# Patient Record
Sex: Female | Born: 1965 | Race: White | Hispanic: No | Marital: Married
Health system: Southern US, Community
[De-identification: ages and names within clinical notes are randomized; demographics above are authoritative.]

---

## 2014-04-24 ENCOUNTER — Encounter (INDEPENDENT_AMBULATORY_CARE_PROVIDER_SITE_OTHER): Payer: Self-pay | Admitting: *Deleted

## 2014-05-16 ENCOUNTER — Ambulatory Visit (INDEPENDENT_AMBULATORY_CARE_PROVIDER_SITE_OTHER): Payer: Self-pay | Admitting: Internal Medicine

## 2017-12-25 ENCOUNTER — Other Ambulatory Visit: Payer: Self-pay | Admitting: Gastroenterology

## 2017-12-25 DIAGNOSIS — R1032 Left lower quadrant pain: Secondary | ICD-10-CM

## 2017-12-30 ENCOUNTER — Ambulatory Visit
Admission: RE | Admit: 2017-12-30 | Discharge: 2017-12-30 | Disposition: A | Discharge status: Home / Self Care | Payer: BLUE CROSS/BLUE SHIELD | Source: Ambulatory Visit | Attending: Gastroenterology | Admitting: Gastroenterology

## 2017-12-30 ENCOUNTER — Encounter: Payer: Self-pay | Admitting: Radiology

## 2017-12-30 DIAGNOSIS — R1032 Left lower quadrant pain: Secondary | ICD-10-CM

## 2017-12-30 IMAGING — CT CT ABD-PELV W/ CM
2 of 5 series · 13 of 46 positions shown, 15 images · IV contrast (iopamidol)
Comparison: None.

CLINICAL DATA: Bilateral lower quadrant pain for 2 months.
Suspected diverticulitis. Previous appendectomy.

EXAM:
CT ABDOMEN AND PELVIS WITH CONTRAST
TECHNIQUE: Multidetector CT imaging of the abdomen and pelvis was performed
using the standard protocol following bolus administration of
intravenous contrast.
CONTRAST:  100mL [MK] IOPAMIDOL ([MK]) INJECTION 61%

[Series 2: abd pelvis 5.00 br40 s3 ax · axial · 0.44mm/px · z∈[+1126,+1466]mm · 10 of 76 slices shown, 12 images]
[im 4/76  soft-tissue]
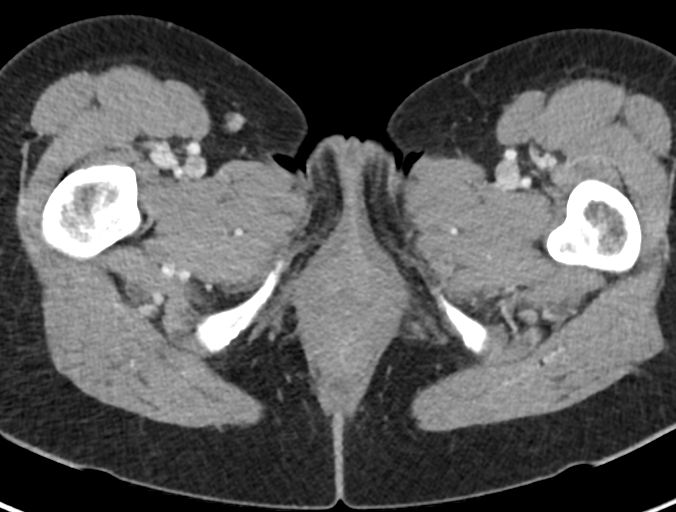
[im 4/76  bone]
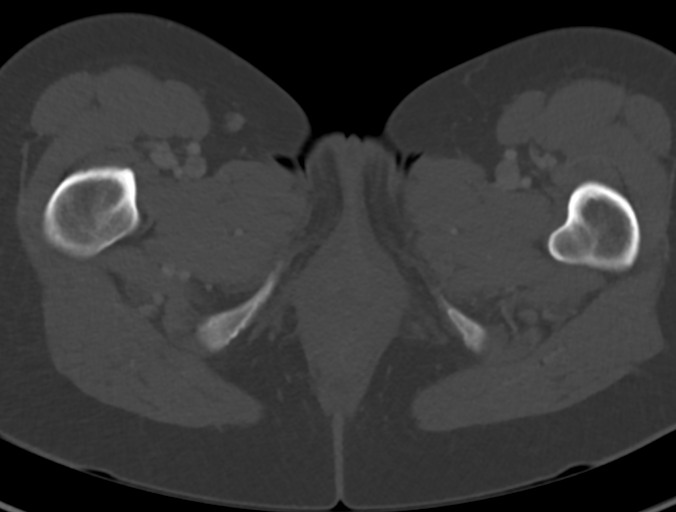
[im 12/76  soft-tissue]
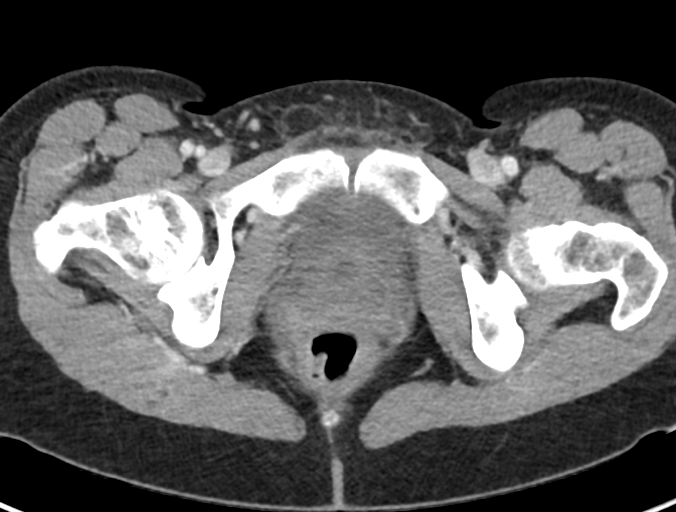
[im 19/76  soft-tissue]
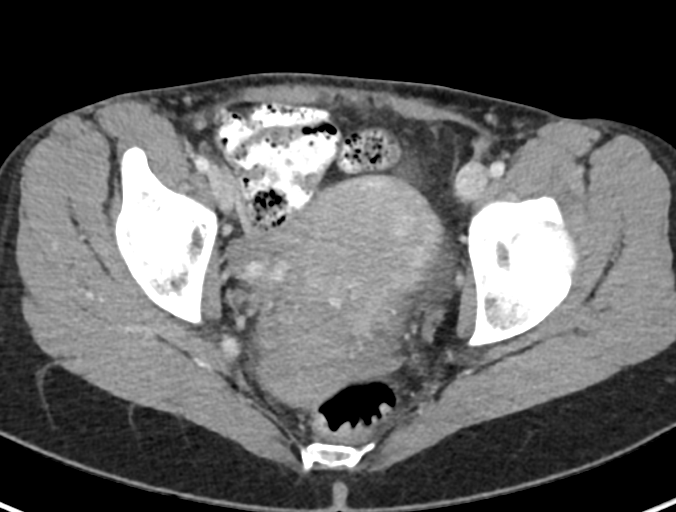
[im 27/76  soft-tissue]
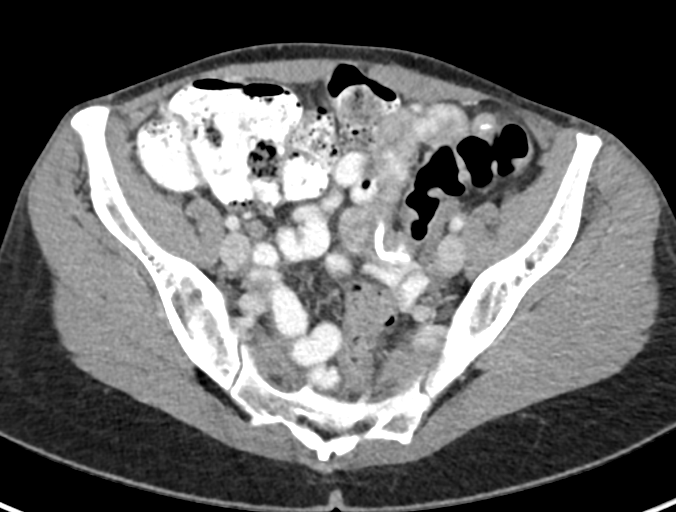
[im 34/76  soft-tissue]
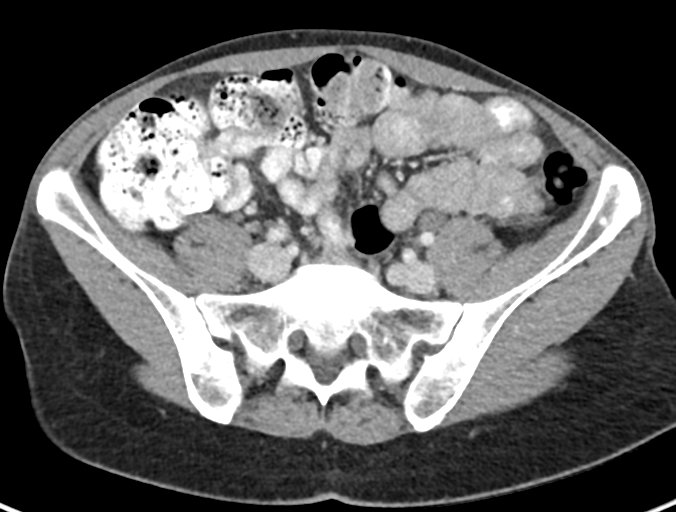
[im 42/76  soft-tissue]
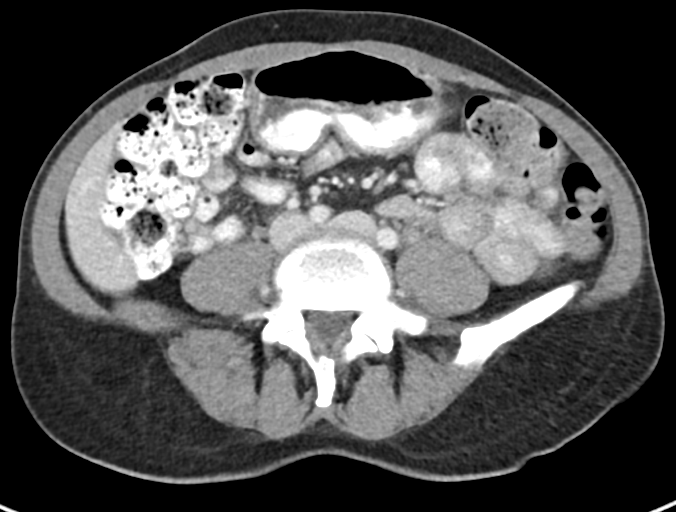
[im 49/76  soft-tissue]
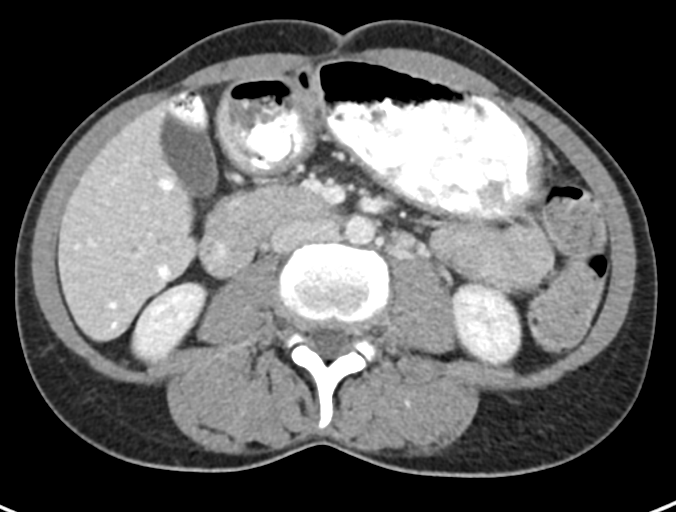
[im 57/76  soft-tissue]
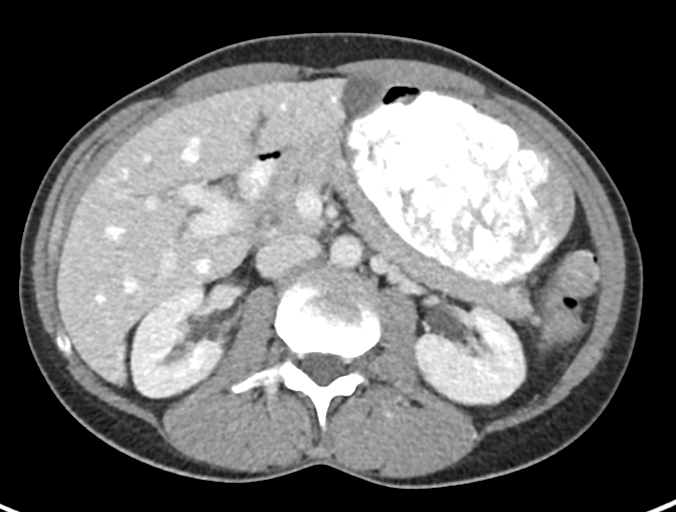
[im 64/76  soft-tissue]
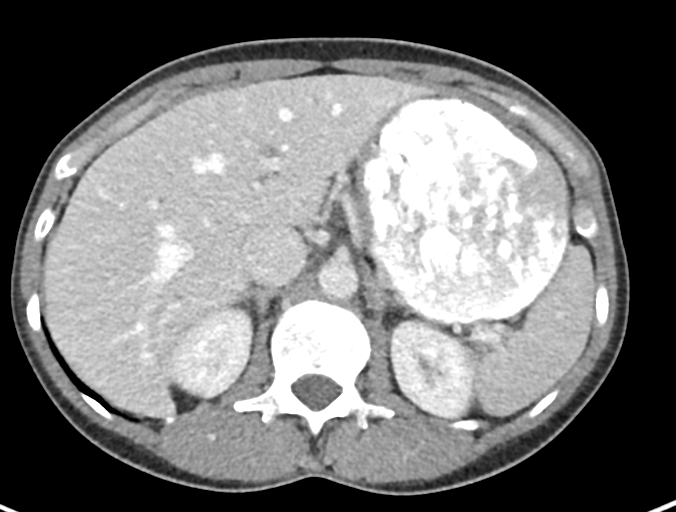
[im 64/76  bone]
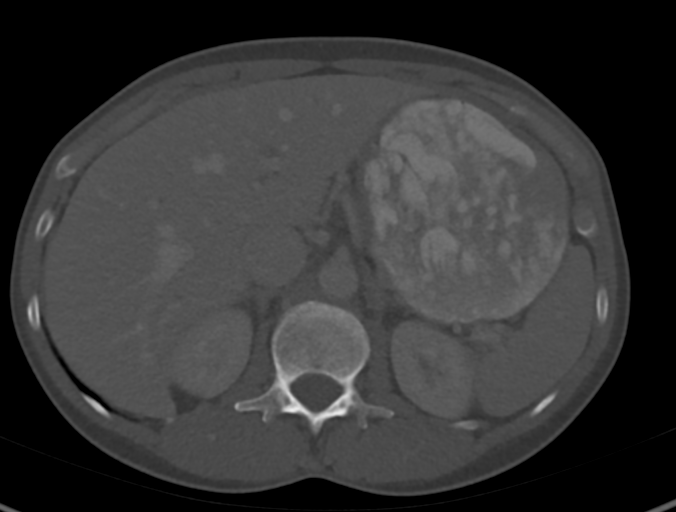
[im 72/76  soft-tissue]
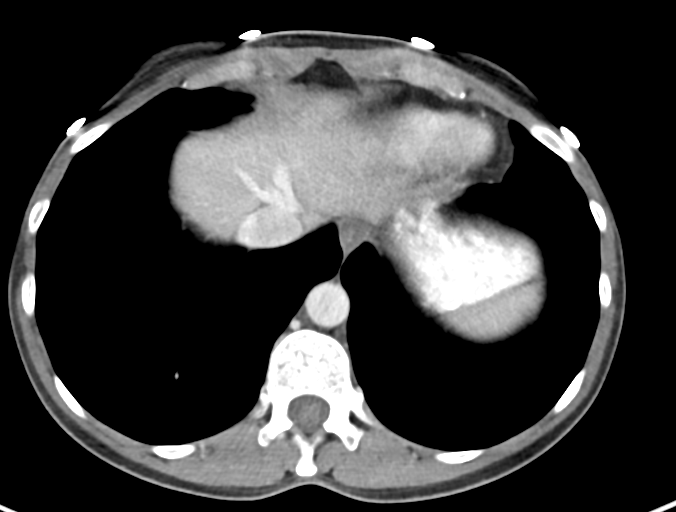

[Series 6: abd pelvis 2.00 br40 s3 cor · coronal · 0.58mm/px · 3 of 108 slices shown]
[im 36/108  soft-tissue]
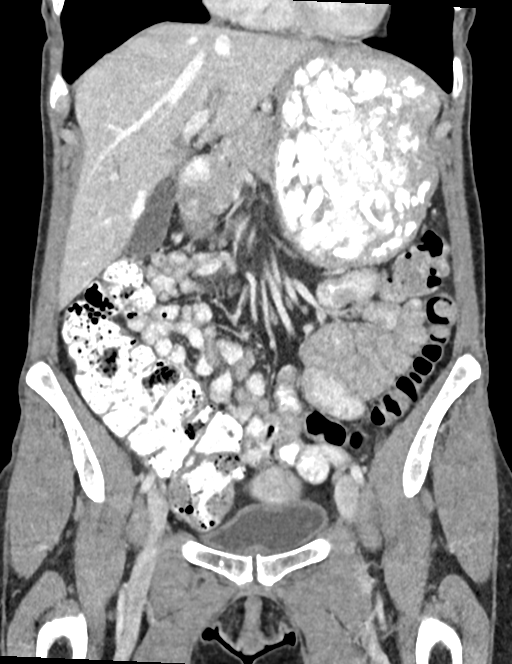
[im 48/108  soft-tissue]
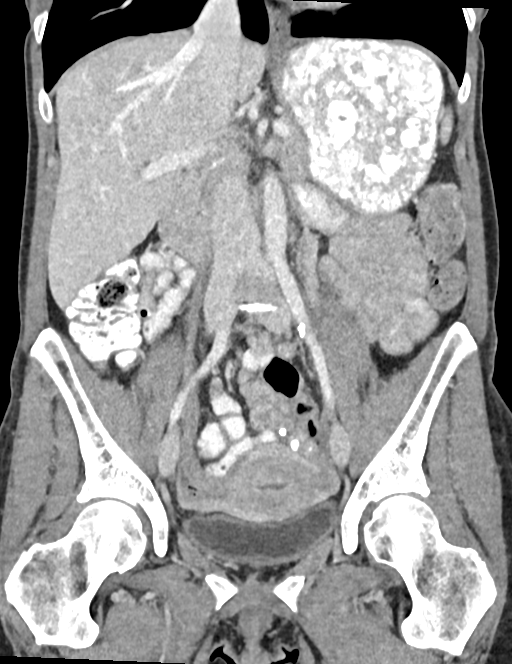
[im 60/108  soft-tissue]
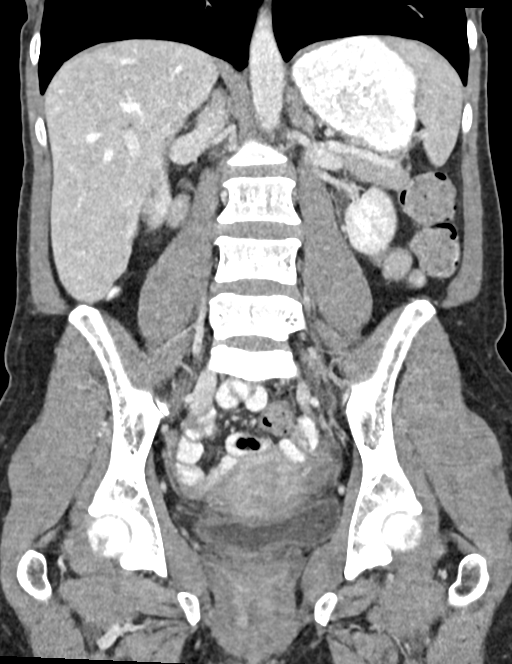

[13 of 46 positions shown; findings below may reference images not displayed]

FINDINGS: Lower Chest: No acute findings.

Hepatobiliary: No hepatic masses identified. Small cyst noted in
posterior right hepatic lobe. Gallbladder is unremarkable.

Pancreas:  No mass or inflammatory changes.

Spleen: Within normal limits in size and appearance.

Adrenals/Urinary Tract: No masses identified. Tiny sub-cm left renal
cyst noted. No evidence of hydronephrosis.

Stomach/Bowel: No evidence of obstruction, inflammatory process or
abnormal fluid collections. Moderate to large amount of colonic
stool noted.

Vascular/Lymphatic: No pathologically enlarged lymph nodes. No
abdominal aortic aneurysm.

Reproductive:  No mass or other significant abnormality.

Other:  None.

Musculoskeletal:  No suspicious bone lesions identified.
IMPRESSION: No evidence of diverticulitis or other acute findings.

Moderate to large stool burden noted; recommend clinical correlation
for possible constipation.

## 2017-12-30 MED ORDER — IOPAMIDOL (ISOVUE-300) INJECTION 61%
100.0000 mL | Freq: Once | INTRAVENOUS | Status: AC | PRN
  Administered 2017-12-30: 100 mL via INTRAVENOUS

## 2018-09-15 ENCOUNTER — Encounter (HOSPITAL_COMMUNITY): Payer: Self-pay | Admitting: Gastroenterology

## 2018-09-15 ENCOUNTER — Encounter (HOSPITAL_COMMUNITY)
Admission: RE | Disposition: A | Discharge status: Home / Self Care | Payer: Self-pay | Source: Home / Self Care | Attending: Gastroenterology

## 2018-09-15 ENCOUNTER — Ambulatory Visit (HOSPITAL_COMMUNITY)
Admission: RE | Admit: 2018-09-15 | Discharge: 2018-09-15 | Disposition: A | Discharge status: Home / Self Care | Payer: BC Managed Care – PPO | Attending: Gastroenterology | Admitting: Gastroenterology

## 2018-09-15 DIAGNOSIS — D509 Iron deficiency anemia, unspecified: Secondary | ICD-10-CM | POA: Diagnosis present

## 2018-09-15 HISTORY — PX: GIVENS CAPSULE STUDY: SHX5432

## 2018-09-15 SURGERY — IMAGING PROCEDURE, GI TRACT, INTRALUMINAL, VIA CAPSULE

## 2018-09-15 SURGICAL SUPPLY — 1 items: TOWEL COTTON PACK 4EA (MISCELLANEOUS) ×6 IMPLANT

## 2018-09-15 NOTE — Progress Notes (Signed)
Pt ingested small pill cam 0800.  Patient verbalized understanding of teaching.  Plans to return pill prior to 5pm today secondary to unable to return tomorrow am.  Tolerated well

## 2018-09-29 ENCOUNTER — Telehealth: Payer: Self-pay | Admitting: Hematology and Oncology

## 2018-09-29 NOTE — Telephone Encounter (Signed)
Received a new hem referral from Dr. Collene Mares for IDA. Ms. Mullally cld to schedule an appt with Dr. Lindi Adie on 9/2 at 1pm. She's been made aware to arrive 15 minutes early.

## 2018-10-18 ENCOUNTER — Telehealth: Payer: Self-pay | Admitting: Hematology and Oncology

## 2018-10-18 NOTE — Telephone Encounter (Signed)
Returned Ms. Kozicki to r/s appt w/Dr. Lindi Adie to 8/21 at 1pm.

## 2018-10-19 ENCOUNTER — Inpatient Hospital Stay: Payer: BC Managed Care – PPO | Admitting: Hematology and Oncology

## 2018-11-05 ENCOUNTER — Telehealth: Payer: Self-pay | Admitting: Hematology and Oncology

## 2018-11-05 NOTE — Telephone Encounter (Signed)
Returned patient's phone call regarding 09/21 appointment, per patient's request appointment has been cancelled. Patient will give a call back when ready to reschedule.

## 2018-11-08 ENCOUNTER — Inpatient Hospital Stay: Payer: BC Managed Care – PPO | Admitting: Hematology and Oncology
